# Patient Record
Sex: Female | Born: 1950 | Race: White | Hispanic: No | Marital: Single | Smoking: Current every day smoker
Health system: Southern US, Community
[De-identification: ages and names within clinical notes are randomized; demographics above are authoritative.]

## PROBLEM LIST (undated history)

## (undated) DIAGNOSIS — E785 Hyperlipidemia, unspecified: Secondary | ICD-10-CM

## (undated) DIAGNOSIS — I10 Essential (primary) hypertension: Secondary | ICD-10-CM

## (undated) DIAGNOSIS — J9819 Other pulmonary collapse: Secondary | ICD-10-CM

## (undated) DIAGNOSIS — C801 Malignant (primary) neoplasm, unspecified: Secondary | ICD-10-CM

## (undated) HISTORY — DX: Malignant (primary) neoplasm, unspecified: C80.1

## (undated) HISTORY — DX: Hyperlipidemia, unspecified: E78.5

## (undated) HISTORY — DX: Essential (primary) hypertension: I10

## (undated) HISTORY — DX: Other pulmonary collapse: J98.19

---

## 2004-10-14 ENCOUNTER — Emergency Department: Payer: Self-pay | Admitting: Unknown Physician Specialty

## 2004-10-14 ENCOUNTER — Ambulatory Visit: Payer: Self-pay | Admitting: Unknown Physician Specialty

## 2004-11-21 ENCOUNTER — Ambulatory Visit: Payer: Self-pay | Admitting: Internal Medicine

## 2004-11-21 LAB — HM COLONOSCOPY

## 2006-04-28 ENCOUNTER — Inpatient Hospital Stay: Payer: Self-pay | Admitting: Internal Medicine

## 2008-12-27 ENCOUNTER — Ambulatory Visit: Payer: Self-pay | Admitting: Orthopedic Surgery

## 2009-01-04 ENCOUNTER — Ambulatory Visit: Payer: Self-pay | Admitting: Orthopedic Surgery

## 2011-03-12 LAB — HM MAMMOGRAPHY

## 2012-11-09 ENCOUNTER — Ambulatory Visit: Payer: Self-pay

## 2013-03-02 ENCOUNTER — Ambulatory Visit: Payer: Self-pay

## 2013-03-28 LAB — HM PAP SMEAR

## 2014-06-19 IMAGING — CR DG HIP COMPLETE 2+V*L*
1 series · 3 of 3 positions shown · non-contrast
Comparison: None.

CLINICAL DATA: Left hip pain, groin pain for 2 weeks

EXAM:
LEFT HIP - COMPLETE 2+ VIEW

[Series 1: ap · 0.17mm/px · 3 of 3 slices shown]
[im 1/3]
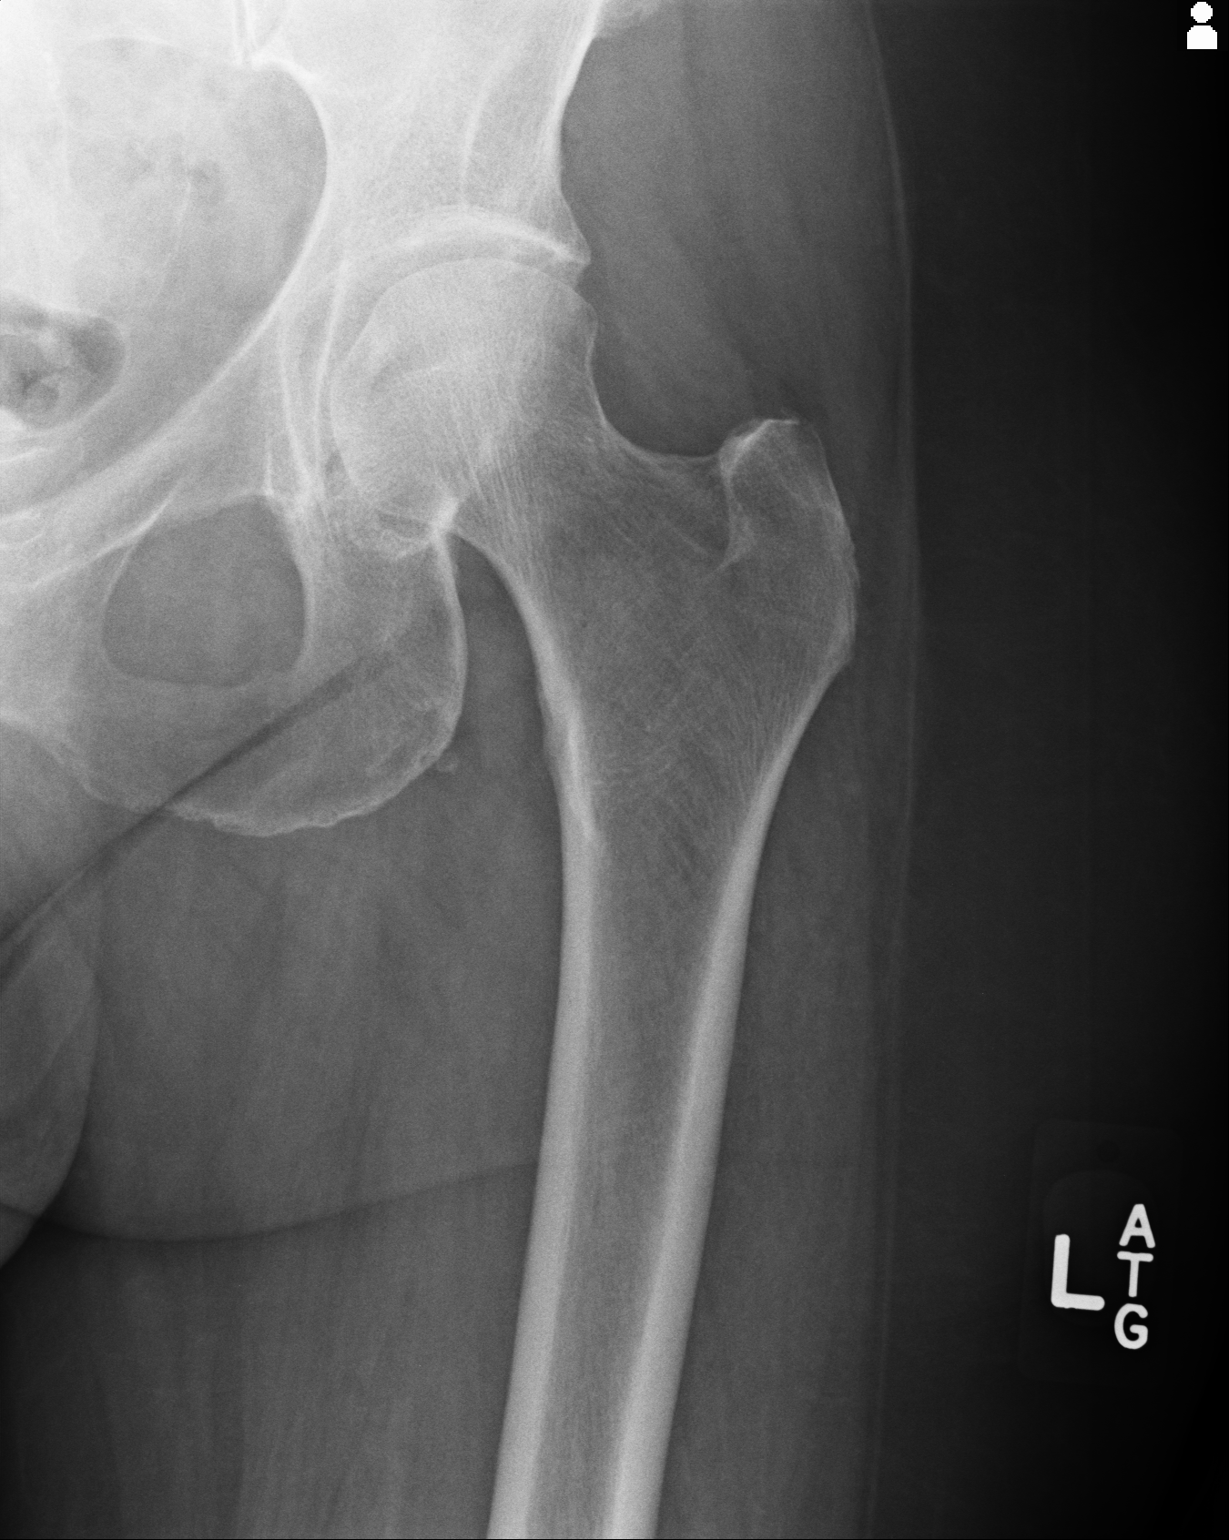
[im 2/3]
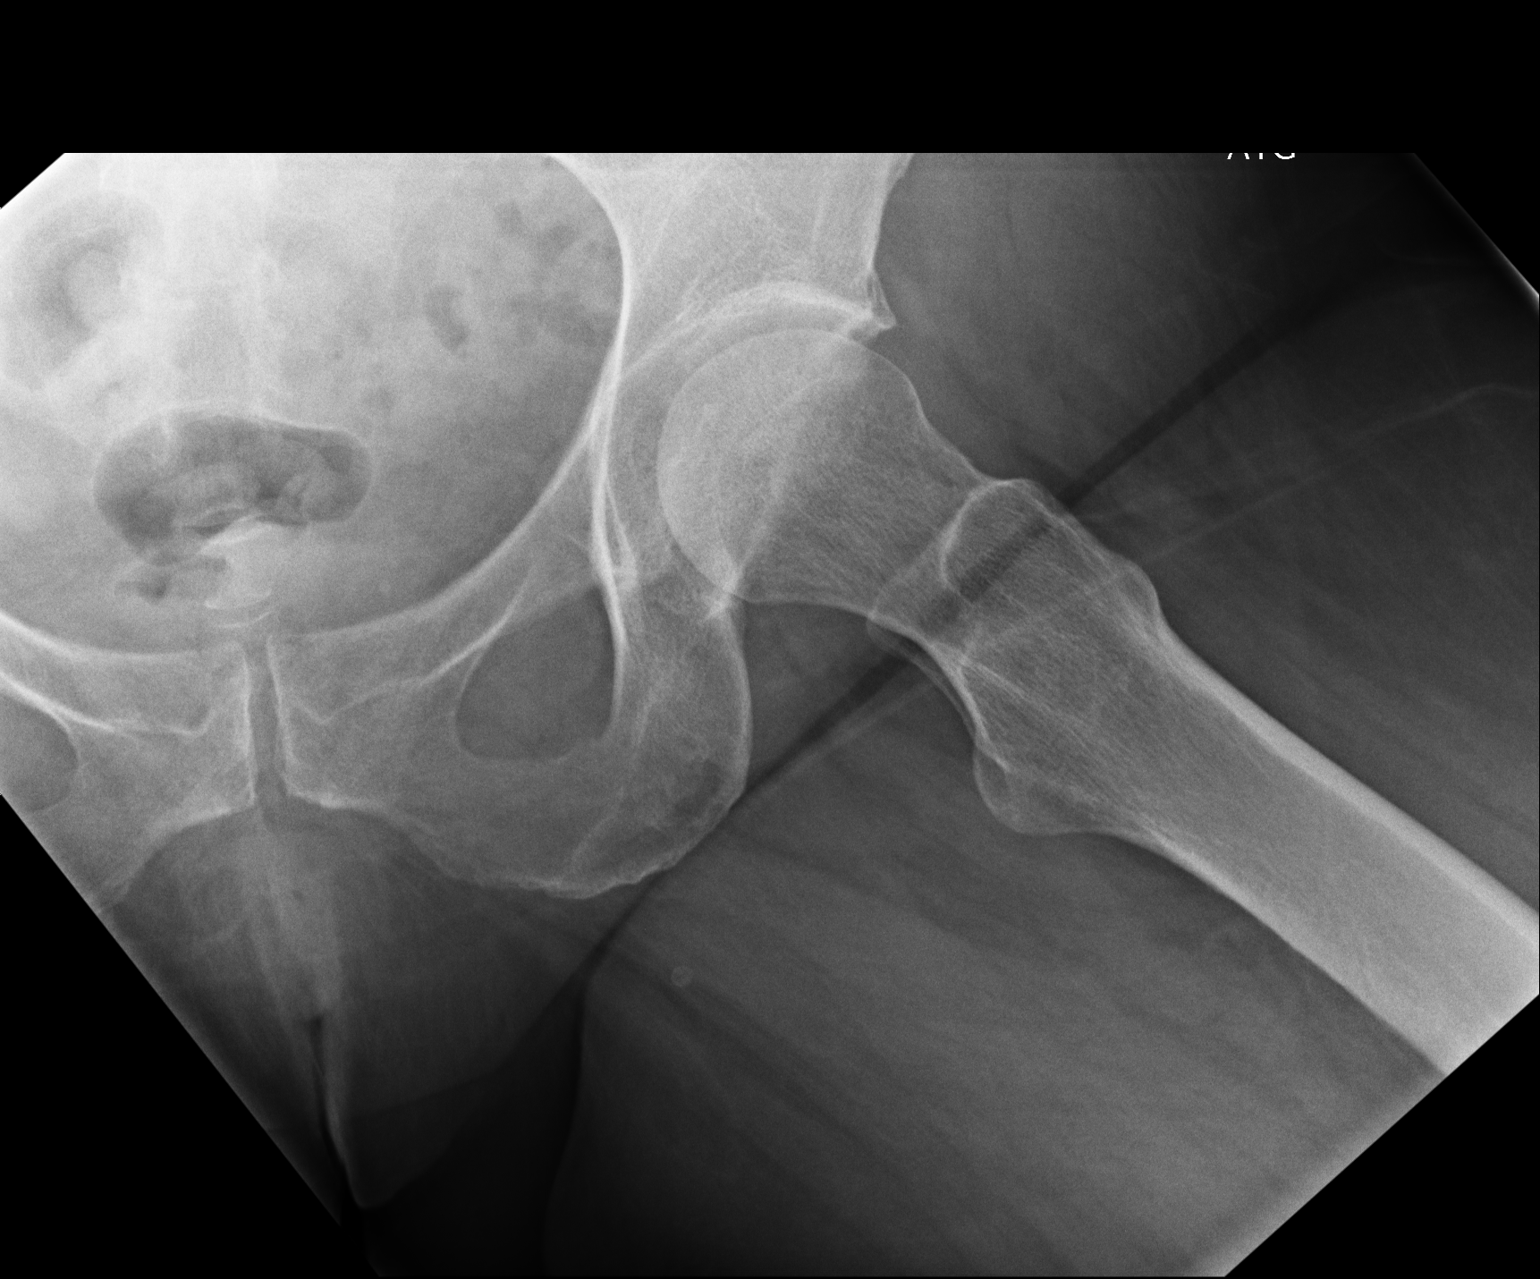
[im 3/3]
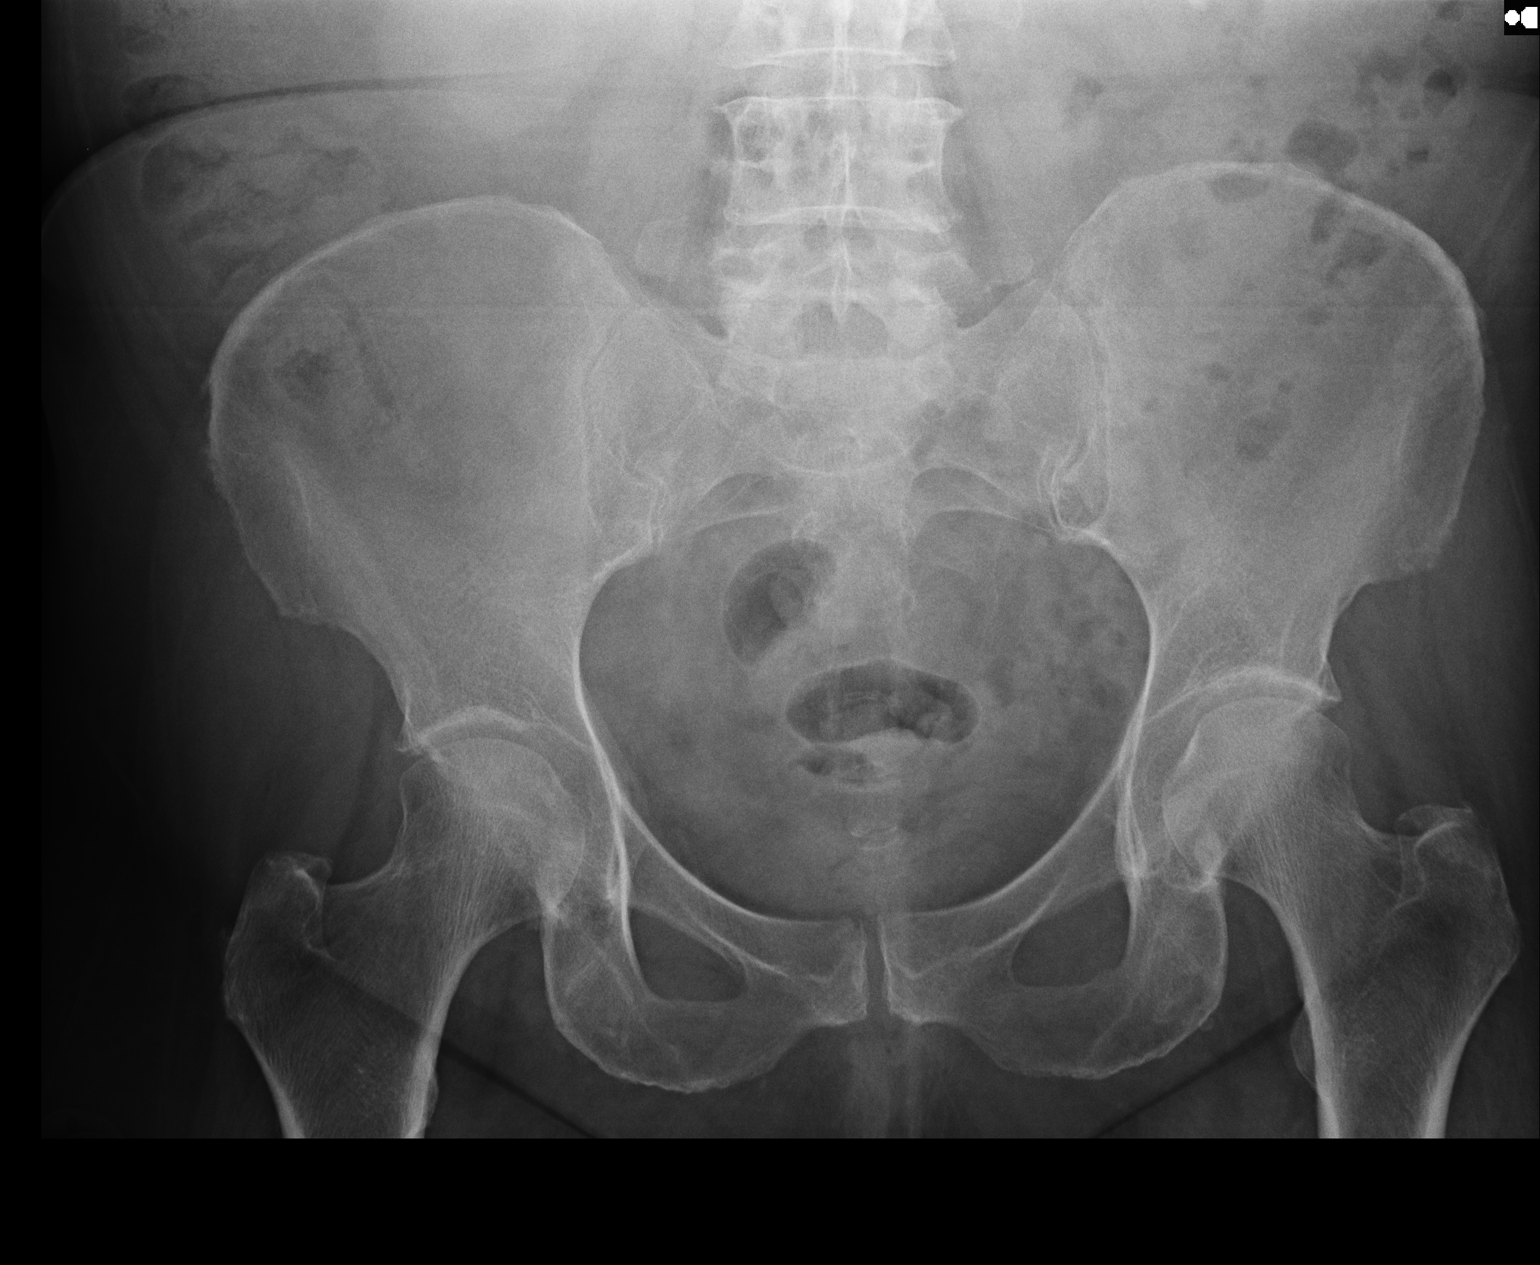

[3 of 3 positions shown; findings below may reference images not displayed]

FINDINGS: There is no evidence of hip fracture or dislocation. There is no
evidence of arthropathy or other focal bone abnormality.
IMPRESSION: Negative.

## 2015-01-05 DIAGNOSIS — Z23 Encounter for immunization: Secondary | ICD-10-CM | POA: Diagnosis not present

## 2015-01-23 DIAGNOSIS — K5790 Diverticulosis of intestine, part unspecified, without perforation or abscess without bleeding: Secondary | ICD-10-CM | POA: Insufficient documentation

## 2015-01-23 DIAGNOSIS — E785 Hyperlipidemia, unspecified: Secondary | ICD-10-CM | POA: Insufficient documentation

## 2015-01-23 DIAGNOSIS — E669 Obesity, unspecified: Secondary | ICD-10-CM | POA: Insufficient documentation

## 2015-01-23 DIAGNOSIS — J449 Chronic obstructive pulmonary disease, unspecified: Secondary | ICD-10-CM | POA: Insufficient documentation

## 2015-01-23 DIAGNOSIS — M8580 Other specified disorders of bone density and structure, unspecified site: Secondary | ICD-10-CM

## 2015-01-23 DIAGNOSIS — I10 Essential (primary) hypertension: Secondary | ICD-10-CM | POA: Insufficient documentation

## 2015-01-23 DIAGNOSIS — R011 Cardiac murmur, unspecified: Secondary | ICD-10-CM | POA: Insufficient documentation

## 2015-04-16 ENCOUNTER — Telehealth: Payer: Self-pay | Admitting: Unknown Physician Specialty

## 2015-04-16 MED ORDER — ATORVASTATIN CALCIUM 20 MG PO TABS: 20.0000 mg | ORAL_TABLET | Freq: Every day | ORAL | Status: DC

## 2015-04-16 MED ORDER — LISINOPRIL 20 MG PO TABS
20.0000 mg | ORAL_TABLET | Freq: Every day | ORAL | Status: DC
Start: 2015-04-16 — End: 2015-05-01

## 2015-04-16 NOTE — Telephone Encounter (Signed)
Pt needs refill on atorvastatin (LIPITOR) 20 MG tablet and lisinopril (PRINIVIL,ZESTRIL) 20 MG tablet sent to [address] pharmacy

## 2015-04-16 NOTE — Telephone Encounter (Signed)
Routing to provider. Patient has upcoming appointment 05/01/15.

## 2015-04-16 NOTE — Telephone Encounter (Signed)
Rxs sent to her pharmacy 

## 2015-05-01 ENCOUNTER — Encounter: Payer: Self-pay | Admitting: Unknown Physician Specialty

## 2015-05-01 ENCOUNTER — Ambulatory Visit (INDEPENDENT_AMBULATORY_CARE_PROVIDER_SITE_OTHER): Payer: Medicare Other | Admitting: Unknown Physician Specialty

## 2015-05-01 ENCOUNTER — Telehealth: Payer: Self-pay | Admitting: Unknown Physician Specialty

## 2015-05-01 ENCOUNTER — Telehealth: Payer: Self-pay

## 2015-05-01 ENCOUNTER — Ambulatory Visit
Admission: RE | Admit: 2015-05-01 | Discharge: 2015-05-01 | Disposition: A | Discharge status: Home / Self Care | Payer: Medicare Other | Source: Ambulatory Visit | Attending: Unknown Physician Specialty | Admitting: Unknown Physician Specialty

## 2015-05-01 VITALS — BP 157/79 | HR 93 | Temp 97.6°F | Ht 64.1 in | Wt 151.2 lb

## 2015-05-01 DIAGNOSIS — Z13820 Encounter for screening for osteoporosis: Secondary | ICD-10-CM

## 2015-05-01 DIAGNOSIS — F17200 Nicotine dependence, unspecified, uncomplicated: Secondary | ICD-10-CM | POA: Diagnosis not present

## 2015-05-01 DIAGNOSIS — R634 Abnormal weight loss: Secondary | ICD-10-CM

## 2015-05-01 DIAGNOSIS — I10 Essential (primary) hypertension: Secondary | ICD-10-CM

## 2015-05-01 DIAGNOSIS — E785 Hyperlipidemia, unspecified: Secondary | ICD-10-CM | POA: Diagnosis not present

## 2015-05-01 DIAGNOSIS — Z1231 Encounter for screening mammogram for malignant neoplasm of breast: Secondary | ICD-10-CM

## 2015-05-01 DIAGNOSIS — R011 Cardiac murmur, unspecified: Secondary | ICD-10-CM

## 2015-05-01 DIAGNOSIS — Z0000 Encounter for general adult medical examination without abnormal findings: Secondary | ICD-10-CM | POA: Diagnosis not present

## 2015-05-01 DIAGNOSIS — J449 Chronic obstructive pulmonary disease, unspecified: Secondary | ICD-10-CM | POA: Insufficient documentation

## 2015-05-01 DIAGNOSIS — Z23 Encounter for immunization: Secondary | ICD-10-CM | POA: Diagnosis not present

## 2015-05-01 LAB — MICROALBUMIN, URINE WAIVED
CREATININE, URINE WAIVED: 10 mg/dL (ref 10–300)
MICROALB, UR WAIVED: 10 mg/L (ref 0–19)
Microalb/Creat Ratio: <30 mg/g (ref <30)

## 2015-05-01 MED ORDER — VERAPAMIL HCL ER 240 MG PO TBCR: 240.0000 mg | EXTENDED_RELEASE_TABLET | Freq: Every day | ORAL | Status: DC

## 2015-05-01 MED ORDER — ATORVASTATIN CALCIUM 20 MG PO TABS: 20.0000 mg | ORAL_TABLET | Freq: Every day | ORAL | Status: AC

## 2015-05-01 MED ORDER — LISINOPRIL 20 MG PO TABS: 20.0000 mg | ORAL_TABLET | Freq: Every day | ORAL | Status: DC

## 2015-05-01 MED ORDER — VERAPAMIL HCL ER 240 MG PO CP24: 240.0000 mg | ORAL_CAPSULE | Freq: Every day | ORAL | Status: DC

## 2015-05-01 NOTE — Patient Instructions (Signed)
Go to the [employer] office today to get a chest x-ray  Please do call to schedule your mammogram and bone density; the number to schedule one at either [facility] or [facility] is [phone]

## 2015-05-01 NOTE — Progress Notes (Signed)
BP 157/79 mmHg  Pulse 93  Temp(Src) 97.6 F (36.4 C)  Ht 5' 4.1" (1.628 m)  Wt 151 lb 3.2 oz (68.584 kg)  BMI 25.88 kg/m2  SpO2 94%  LMP  (LMP Unknown)   Subjective:    Patient ID: Veronica Hernandez, female    DOB: [birth year], 64 y.o.   MRN: [id number]  HPI: Veronica Hernandez is a 65 y.o. female  Chief Complaint  Patient presents with  . Medicare Wellness  . Medication Refill    pt states she needs refills on all meds   Functional Status Survey: Is the patient deaf or have difficulty hearing?: No Does the patient have difficulty seeing, even when wearing glasses/contacts?: Yes (pt states she has to wear her glasses) Does the patient have difficulty concentrating, remembering, or making decisions?: No Does the patient have difficulty walking or climbing stairs?: No Does the patient have difficulty dressing or bathing?: No Does the patient have difficulty doing errands alone such as visiting a doctor's office or shopping?: No Fall Risk  05/01/2015  Falls in the past year? No   Depression screen PHQ 2/9 05/01/2015  Decreased Interest 0  Down, Depressed, Hopeless 0  PHQ - 2 Score 0    Family History  Problem Relation Age of Onset  . Hypertension Mother   . Stroke Mother   . Diabetes Mother   . Glaucoma Mother   . Cancer Sister     ovarian and skin  . Glaucoma Sister   . Hypertension Sister   . Hypertension Sister   . Cancer Brother   . Diabetes Brother   . Hypertension Brother   . Glaucoma Brother    Social History   Social History  . Marital Status: Single    Spouse Name: N/A  . Number of Children: N/A  . Years of Education: N/A   Social History Main Topics  . Smoking status: Current Every Day Smoker -- 0.50 packs/day    Types: Cigarettes  . Smokeless tobacco: Never Used  . Alcohol Use: 3.6 oz/week    0 Standard drinks or equivalent, 6 Cans of beer per week  . Drug Use: No  . Sexual Activity: No   Other Topics Concern  . None   Social History Narrative    Past Medical History  Diagnosis Date  . Lung collapse   . Hypertension   . Hyperlipidemia   . Cancer ([place])     basal cell on face    Hypertension Using medications without difficulty  No problems or lightheadedness No chest pain with exertion or shortness of breath No Edema   Hyperlipidemia Using medications without problems: No Muscle aches  Diet compliance Exercise:  COPD Feels symptoms are well controlled Using medications without problems Night time symptoms: none ER visits since last visit:none Missed work or school::none Increased cough:none Increased [id number]  Weight loss Noted over 20 pound weight loss.  Pt states she is not trying to lose weight but "cut back on eating."  Smoking 1/2 ppd for many years    Relevant past medical, surgical, family and social history reviewed and updated as indicated. Interim medical history since our last visit reviewed. Allergies and medications reviewed and updated.  Review of Systems  Per HPI unless specifically indicated above     Objective:    BP 157/79 mmHg  Pulse 93  Temp(Src) 97.6 F (36.4 C)  Ht 5' 4.1" (1.628 m)  Wt 151 lb 3.2 oz (68.584 kg)  BMI 25.88 kg/m2  SpO2  94%  LMP  (LMP Unknown)  Wt Readings from Last 3 Encounters:  05/01/15 151 lb 3.2 oz (68.584 kg)  04/26/14 176 lb (79.833 kg)    Physical Exam  Constitutional: She is oriented to person, place, and time. She appears well-developed and well-nourished.  HENT:  Head: Normocephalic and atraumatic.  Eyes: Pupils are equal, round, and reactive to light. Right eye exhibits no discharge. Left eye exhibits no discharge. No scleral icterus.  Neck: Normal range of motion. Neck supple. Carotid bruit is not present. No thyromegaly present.  Cardiovascular: Normal rate and regular rhythm.  Exam reveals no gallop and no friction rub.   Murmur heard.  Systolic murmur is present with a grade of 2/6  Pulmonary/Chest: Effort normal and breath sounds  normal. No respiratory distress. She has no wheezes. She has no rales.  Abdominal: Soft. Bowel sounds are normal. There is no tenderness. There is no rebound.  Genitourinary: No breast swelling, tenderness or discharge.  Musculoskeletal: Normal range of motion.  Lymphadenopathy:    She has no cervical adenopathy.  Neurological: She is alert and oriented to person, place, and time.  Skin: Skin is warm, dry and intact. No rash noted.  Psychiatric: She has a normal mood and affect. Her speech is normal and behavior is normal. Judgment and thought content normal. Cognition and memory are normal.    Results for orders placed or performed in visit on 01/23/15  HM MAMMOGRAPHY  Result Value Ref Range   HM Mammogram from PP   HM PAP SMEAR  Result Value Ref Range   HM Pap smear from PP   HM COLONOSCOPY  Result Value Ref Range   HM Colonoscopy from PP       Assessment & Plan:   Problem List Items Addressed This Visit      Unprioritized   Hypertension   Relevant Medications   atorvastatin (LIPITOR) 20 MG tablet   lisinopril (PRINIVIL,ZESTRIL) 20 MG tablet   verapamil (VERELAN PM) 240 MG 24 hr capsule   Other Relevant Orders   Comprehensive metabolic panel   Microalbumin, Urine Waived   Uric acid   Hyperlipidemia   Relevant Medications   atorvastatin (LIPITOR) 20 MG tablet   lisinopril (PRINIVIL,ZESTRIL) 20 MG tablet   verapamil (VERELAN PM) 240 MG 24 hr capsule   Heart murmur    No change.  Right atrial enlargement on EKG.  ? Pulmonary disease.  Chest x-ray ordered      Abnormal weight loss   Relevant Orders   DG Chest 2 View   CBC with Differential/Platelet   TSH   HIV antibody   Tobacco dependence   Relevant Orders   Lipid Panel w/o Chol/HDL Ratio    Other Visit Diagnoses    Need for pneumococcal vaccination    -  Primary    Relevant Orders    Pneumococcal conjugate vaccine 13-valent IM (Completed)    Routine general medical examination at a health care facility         Relevant Orders    EKG 12-Lead (Completed)    Hepatitis C antibody    Cologuard    Encounter for screening mammogram for breast cancer        Relevant Orders    MM DIGITAL SCREENING BILATERAL    Encounter for screening for osteoporosis           Needs chest x-ray for abnormal weight loss.  BP is not to goal but before making any changes, need to evaluated weight  loss.    Unable to find a diagnosis for medicare approval of bone density Will follow at a later date  Follow up plan: Return in about 4 weeks (around 05/29/2015).  Addendum: Chest x-ray is negative.  Await labs, order low dose CT and refer to GI ASAP

## 2015-05-01 NOTE — Telephone Encounter (Signed)
Pharmacy sent a fax stating that verapamil (verelan PM) 240 mg is no such drug. They state Verelan PM comes in 100, 200, or 300 mg and SR comes in 240 and 120 mg. They need clarification on this medication.

## 2015-05-01 NOTE — Assessment & Plan Note (Signed)
No change.  Right atrial enlargement on EKG.  ? Pulmonary disease.  Chest x-ray ordered

## 2015-05-01 NOTE — Telephone Encounter (Signed)
Please call pharmacy and tell them I changed it to Calan-SR and sent it electronically

## 2015-05-01 NOTE — Telephone Encounter (Signed)
Discussed with pt normal x-ray.  Chest CT and appt with GI pending.  Pt needs to go to the draw center for labs as unable to get here

## 2015-05-02 ENCOUNTER — Other Ambulatory Visit: Payer: Self-pay | Admitting: Internal Medicine

## 2015-05-02 DIAGNOSIS — R634 Abnormal weight loss: Secondary | ICD-10-CM | POA: Diagnosis not present

## 2015-05-02 DIAGNOSIS — Z0000 Encounter for general adult medical examination without abnormal findings: Secondary | ICD-10-CM | POA: Diagnosis not present

## 2015-05-02 DIAGNOSIS — F17200 Nicotine dependence, unspecified, uncomplicated: Secondary | ICD-10-CM | POA: Diagnosis not present

## 2015-05-02 DIAGNOSIS — I10 Essential (primary) hypertension: Secondary | ICD-10-CM | POA: Diagnosis not present

## 2015-05-03 LAB — CBC WITH DIFFERENTIAL/PLATELET
BASOS ABS: 0 10*3/uL (ref 0.0–0.2)
Basos: 0 %
EOS (ABSOLUTE): 0 10*3/uL (ref 0.0–0.4)
Eos: 1 %
Hematocrit: 45.4 % (ref 34.0–46.6)
Hemoglobin: 15.8 g/dL (ref 11.1–15.9)
Immature Grans (Abs): 0 10*3/uL (ref 0.0–0.1)
Immature Granulocytes: 0 %
LYMPHS ABS: 1.5 10*3/uL (ref 0.7–3.1)
Lymphs: 19 %
MCH: 33.3 pg — AB (ref 26.6–33.0)
MCHC: 34.8 g/dL (ref 31.5–35.7)
MCV: 96 fL (ref 79–97)
Monocytes Absolute: 0.7 10*3/uL (ref 0.1–0.9)
Monocytes: 8 %
NEUTROS ABS: 5.8 10*3/uL (ref 1.4–7.0)
Neutrophils: 72 %
PLATELETS: 416 10*3/uL — AB (ref 150–379)
RBC: 4.74 x10E6/uL (ref 3.77–5.28)
RDW: 14.5 % (ref 12.3–15.4)
WBC: 8.1 10*3/uL (ref 3.4–10.8)

## 2015-05-03 LAB — COMPREHENSIVE METABOLIC PANEL
ALT: 24 IU/L (ref 0–32)
AST: 22 IU/L (ref 0–40)
Albumin/Globulin Ratio: 1.7 (ref 1.2–2.2)
Albumin: 4.6 g/dL (ref 3.6–4.8)
Alkaline Phosphatase: 97 IU/L (ref 39–117)
BUN/Creatinine Ratio: 11 (ref 11–26)
BUN: 7 mg/dL — ABNORMAL LOW (ref 8–27)
Bilirubin Total: 0.6 mg/dL (ref 0.0–1.2)
CALCIUM: 9.8 mg/dL (ref 8.7–10.3)
CO2: 26 mmol/L (ref 18–29)
CREATININE: 0.64 mg/dL (ref 0.57–1.00)
Chloride: 81 mmol/L — ABNORMAL LOW (ref 96–106)
GFR calc non Af Amer: 94 mL/min/{1.73_m2} (ref >59)
GFR, EST AFRICAN AMERICAN: 108 mL/min/{1.73_m2} (ref >59)
GLUCOSE: 104 mg/dL — AB (ref 65–99)
Globulin, Total: 2.7 g/dL (ref 1.5–4.5)
Potassium: 4.7 mmol/L (ref 3.5–5.2)
SODIUM: 127 mmol/L — AB (ref 134–144)
Total Protein: 7.3 g/dL (ref 6.0–8.5)

## 2015-05-03 LAB — LIPID PANEL W/O CHOL/HDL RATIO
CHOLESTEROL TOTAL: 137 mg/dL (ref 100–199)
HDL: 105 mg/dL (ref >39)
LDL Calculated: 23 mg/dL (ref 0–99)
TRIGLYCERIDES: 43 mg/dL (ref 0–149)
VLDL CHOLESTEROL CAL: 9 mg/dL (ref 5–40)

## 2015-05-03 LAB — HIV ANTIBODY (ROUTINE TESTING W REFLEX): HIV Screen 4th Generation wRfx: NONREACTIVE

## 2015-05-03 LAB — HEPATITIS C ANTIBODY

## 2015-05-03 LAB — TSH: TSH: 1.51 u[IU]/mL (ref 0.450–4.500)

## 2015-05-03 LAB — URIC ACID: Uric Acid: 4.2 mg/dL (ref 2.5–7.1)

## 2015-05-04 ENCOUNTER — Telehealth: Payer: Self-pay | Admitting: Unknown Physician Specialty

## 2015-05-04 ENCOUNTER — Other Ambulatory Visit: Payer: Self-pay | Admitting: Unknown Physician Specialty

## 2015-05-04 DIAGNOSIS — E871 Hypo-osmolality and hyponatremia: Secondary | ICD-10-CM

## 2015-05-04 DIAGNOSIS — F17200 Nicotine dependence, unspecified, uncomplicated: Secondary | ICD-10-CM

## 2015-05-04 DIAGNOSIS — R05 Cough: Secondary | ICD-10-CM

## 2015-05-04 DIAGNOSIS — R059 Cough, unspecified: Secondary | ICD-10-CM

## 2015-05-04 DIAGNOSIS — L309 Dermatitis, unspecified: Secondary | ICD-10-CM

## 2015-05-04 DIAGNOSIS — R634 Abnormal weight loss: Secondary | ICD-10-CM

## 2015-05-04 MED ORDER — TRIAMCINOLONE ACETONIDE 0.1 % EX CREA: 1.0000 "application " | TOPICAL_CREAM | Freq: Two times a day (BID) | CUTANEOUS | Status: AC

## 2015-05-04 NOTE — Telephone Encounter (Signed)
Talked to patient and ordered a CT due to weight loss, hyponatremia, cough and smoker.  Chest x-ray was negative

## 2015-05-09 ENCOUNTER — Ambulatory Visit: Admission: RE | Admit: 2015-05-09 | Payer: Medicare Other | Source: Ambulatory Visit

## 2015-05-21 ENCOUNTER — Other Ambulatory Visit: Payer: Self-pay | Admitting: Unknown Physician Specialty

## 2015-05-21 MED ORDER — ALBUTEROL SULFATE HFA 108 (90 BASE) MCG/ACT IN AERS: 2.0000 | INHALATION_SPRAY | Freq: Four times a day (QID) | RESPIRATORY_TRACT | Status: DC | PRN

## 2015-05-21 NOTE — Telephone Encounter (Signed)
Patient was last seen 05/01/15.

## 2015-05-21 NOTE — Telephone Encounter (Signed)
Pt called stated she needs a refill on her Pro-Air she has not been able to get this refilled. Pharm is [facility] on [address]. Thanks.

## 2015-05-28 ENCOUNTER — Ambulatory Visit: Payer: Medicare Other

## 2015-06-01 ENCOUNTER — Ambulatory Visit: Payer: Medicare Other | Admitting: Unknown Physician Specialty

## 2015-06-01 ENCOUNTER — Telehealth: Payer: Self-pay

## 2015-06-01 NOTE — Telephone Encounter (Signed)
Called and left patient a voicemail asking for her to please return my call.  

## 2015-06-01 NOTE — Telephone Encounter (Signed)
-----   Message from Kathrine Haddock, NP sent at 06/01/2015 12:20 PM EDT ----- Regarding: RE: cancelled CT chest Thanks, [place], can you find out why she cancelled this? ----- Message -----    From: Lieutenant Diego, RN    Sent: 06/01/2015  10:00 AM      To: Kathrine Haddock, NP Subject: cancelled CT chest                             Wellington [place], It looks like Ms. Oran cancelled her CT. Just wanted to make sure everything was okay and that you were aware. Shawn

## 2015-06-01 NOTE — Telephone Encounter (Signed)
Patient returned my call. She stated that she had a bill to pay before she had the CT done. Patient stated she was going to reschedule the CT as soon as she paid the bill off.

## 2015-06-12 ENCOUNTER — Telehealth: Payer: Self-pay | Admitting: Unknown Physician Specialty

## 2015-06-12 NOTE — Telephone Encounter (Signed)
Chest CT is what is ordered in the chart.

## 2015-06-12 NOTE — Telephone Encounter (Signed)
Patient returned call. I let her know that Malachy Mood was wanting a chest CT, not an x-ray. I gave the patient the outpatient imaging center's phone number.

## 2015-06-12 NOTE — Telephone Encounter (Signed)
I know what is in the chart.  Can you ask the pt why she is asking about an x-ray?

## 2015-06-12 NOTE — Telephone Encounter (Signed)
Patient called and wants to know why she needs to have another x-ray done when she had one done on March 28. She would like to talk to Malachy Mood about this, please call patient back [phone].

## 2015-06-12 NOTE — Telephone Encounter (Signed)
Called and left patient a voicemail asking for her to please return my call. m

## 2015-06-12 NOTE — Telephone Encounter (Signed)
Routing to Cheryl

## 2015-06-12 NOTE — Telephone Encounter (Signed)
I ordered a chest CT due to weight loss and COPD.  Not another x-ray.  Can you confirm this [place]?

## 2015-12-09 DIAGNOSIS — Z23 Encounter for immunization: Secondary | ICD-10-CM | POA: Diagnosis not present

## 2016-03-24 ENCOUNTER — Other Ambulatory Visit: Payer: Self-pay | Admitting: Unknown Physician Specialty

## 2016-08-13 ENCOUNTER — Encounter: Payer: Medicare Other | Admitting: Unknown Physician Specialty

## 2016-08-17 IMAGING — DX DG CHEST 2V
2 series · 2 of 2 positions shown · non-contrast
Comparison: 11/09/2012

CLINICAL DATA: Abnormal weight loss

EXAM:
CHEST  2 VIEW

[chest lat]
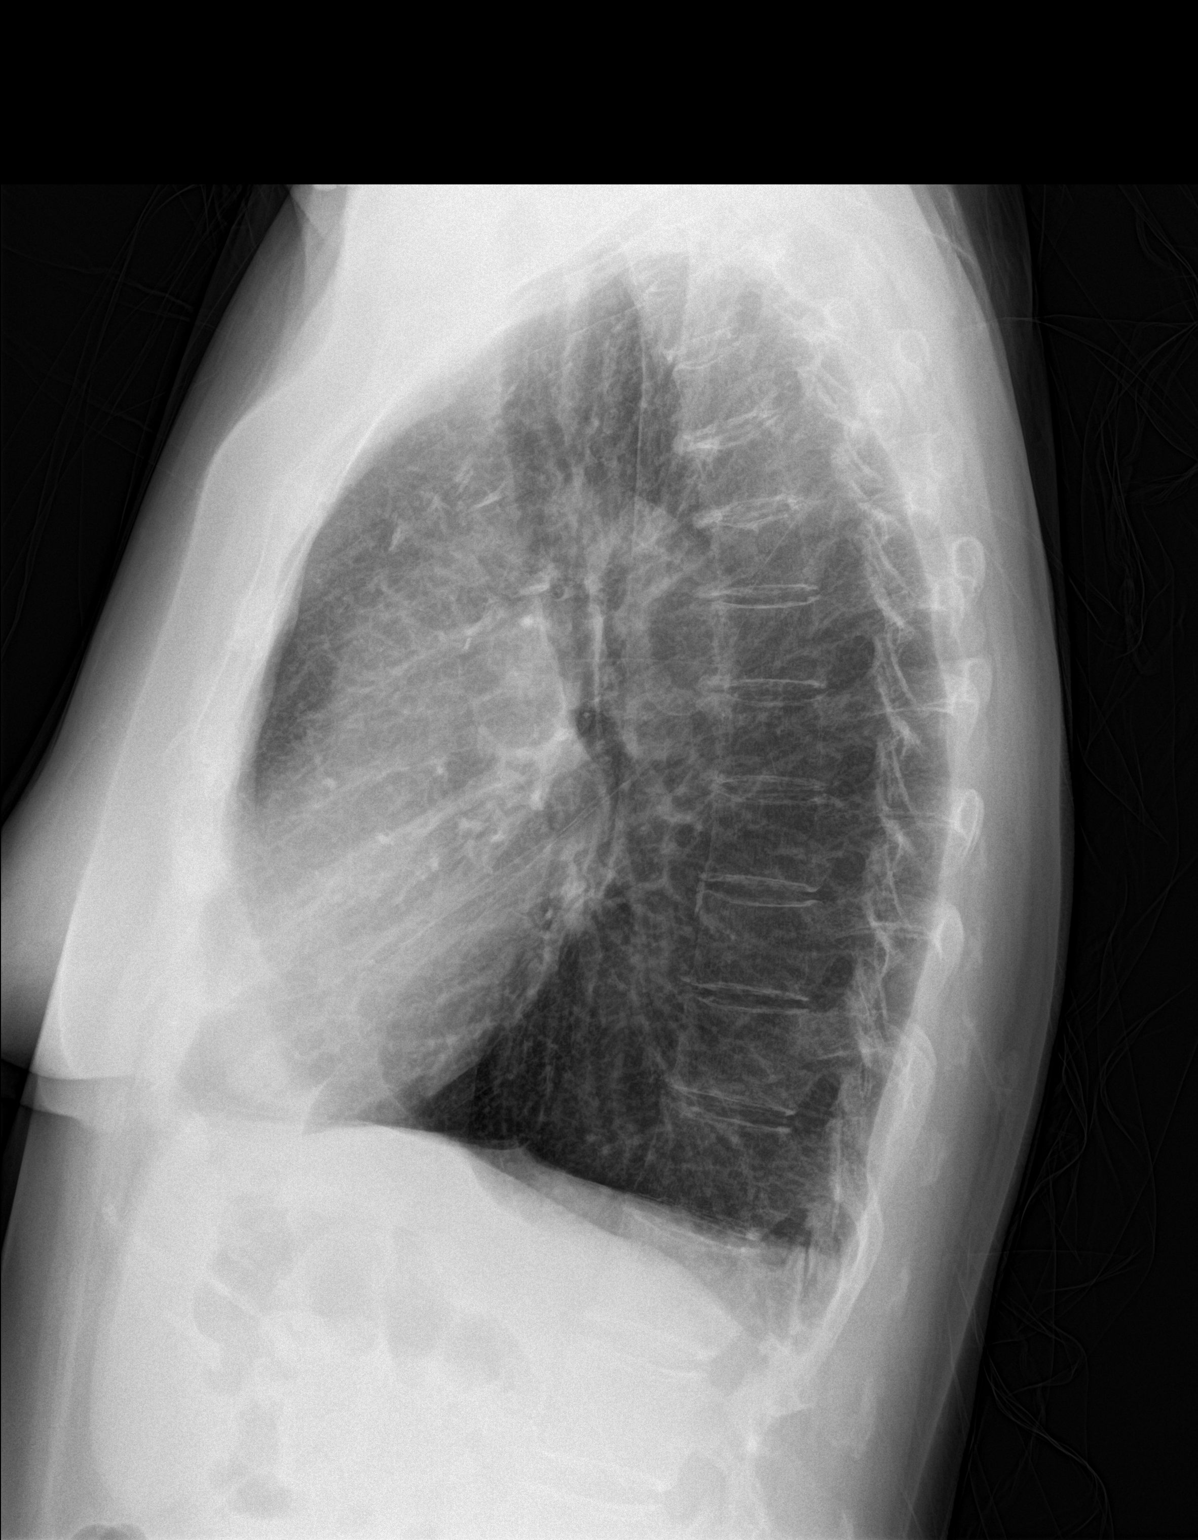

[chest pa]
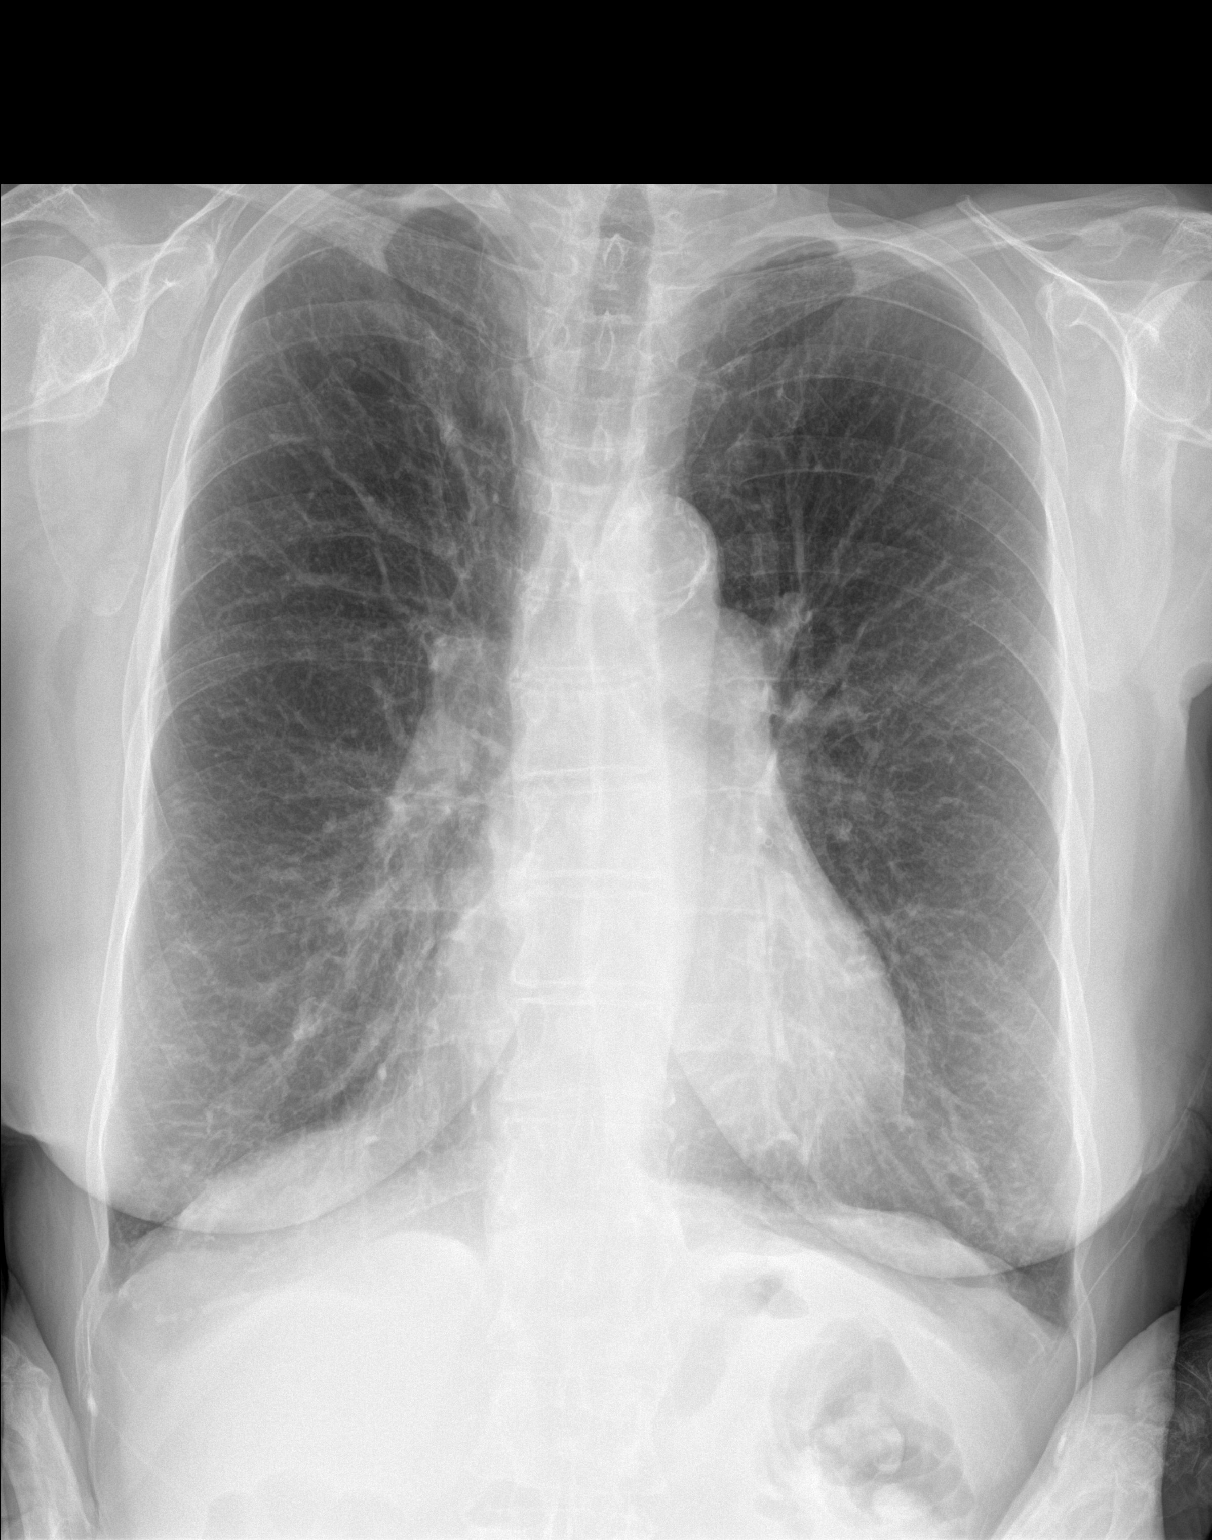

[2 of 2 positions shown; findings below may reference images not displayed]

FINDINGS: The heart size and mediastinal contours are within normal limits.
Both lungs are hyperinflated. Apical scarring is noted. The
visualized skeletal structures are unremarkable.
IMPRESSION: COPD without acute abnormality.

## 2016-09-24 ENCOUNTER — Encounter: Payer: Self-pay | Admitting: Unknown Physician Specialty

## 2016-09-24 ENCOUNTER — Ambulatory Visit (INDEPENDENT_AMBULATORY_CARE_PROVIDER_SITE_OTHER): Payer: Medicare Other | Admitting: Unknown Physician Specialty

## 2016-09-24 VITALS — BP 214/118 | HR 116 | Temp 98.3°F | Ht 63.6 in | Wt 106.1 lb

## 2016-09-24 DIAGNOSIS — I10 Essential (primary) hypertension: Secondary | ICD-10-CM | POA: Diagnosis not present

## 2016-09-24 DIAGNOSIS — J449 Chronic obstructive pulmonary disease, unspecified: Secondary | ICD-10-CM

## 2016-09-24 DIAGNOSIS — Z0000 Encounter for general adult medical examination without abnormal findings: Secondary | ICD-10-CM

## 2016-09-24 DIAGNOSIS — E871 Hypo-osmolality and hyponatremia: Secondary | ICD-10-CM | POA: Insufficient documentation

## 2016-09-24 DIAGNOSIS — R634 Abnormal weight loss: Secondary | ICD-10-CM | POA: Diagnosis not present

## 2016-09-24 DIAGNOSIS — Z23 Encounter for immunization: Secondary | ICD-10-CM

## 2016-09-24 DIAGNOSIS — R011 Cardiac murmur, unspecified: Secondary | ICD-10-CM

## 2016-09-24 DIAGNOSIS — Z7189 Other specified counseling: Secondary | ICD-10-CM

## 2016-09-24 LAB — UA/M W/RFLX CULTURE, ROUTINE
BILIRUBIN UA: NEGATIVE
Glucose, UA: NEGATIVE
LEUKOCYTES UA: NEGATIVE
Nitrite, UA: NEGATIVE
PH UA: 7.5 (ref 5.0–7.5)
RBC, UA: NEGATIVE
SPEC GRAV UA: 1.015 (ref 1.005–1.030)
Urobilinogen, Ur: 0.2 mg/dL (ref 0.2–1.0)

## 2016-09-24 LAB — MICROSCOPIC EXAMINATION: Bacteria, UA: NONE SEEN

## 2016-09-24 MED ORDER — ALBUTEROL SULFATE HFA 108 (90 BASE) MCG/ACT IN AERS: 2.0000 | INHALATION_SPRAY | Freq: Four times a day (QID) | RESPIRATORY_TRACT | 3 refills | Status: AC | PRN

## 2016-09-24 MED ORDER — LISINOPRIL 40 MG PO TABS: 40.0000 mg | ORAL_TABLET | Freq: Every day | ORAL | 3 refills | Status: AC

## 2016-09-24 MED ORDER — AMLODIPINE BESYLATE 10 MG PO TABS: 10.0000 mg | ORAL_TABLET | Freq: Every day | ORAL | 3 refills | Status: AC

## 2016-09-24 MED ORDER — FLUTICASONE-SALMETEROL 250-50 MCG/DOSE IN AEPB: 1.0000 | INHALATION_SPRAY | Freq: Two times a day (BID) | RESPIRATORY_TRACT | 10 refills | Status: AC

## 2016-09-24 MED ORDER — VERAPAMIL HCL ER 240 MG PO TBCR: 240.0000 mg | EXTENDED_RELEASE_TABLET | Freq: Every day | ORAL | 1 refills | Status: AC

## 2016-09-24 NOTE — Patient Instructions (Signed)

## 2016-09-24 NOTE — Assessment & Plan Note (Signed)
With weight loss.  Very concerning for malignancy.  Will check today

## 2016-09-24 NOTE — Assessment & Plan Note (Signed)
Stable, continue present medications.   

## 2016-09-24 NOTE — Assessment & Plan Note (Signed)
A voluntary discussion about advance care planning including the explanation and discussion of advance directives was extensively discussed  with the patient.  Explanation about the health care proxy and Living will was reviewed and packet with forms with explanation of how to fill them out was given.  During this discussion, the patient was able to identify a health care proxy as her boyfriend and plans to fill out the paperwork required.  Patient was offered a separate [place] visit for further assistance with forms.

## 2016-09-24 NOTE — Progress Notes (Signed)
BP (!) 214/118 (BP Location: Left Arm, Cuff Size: Small)   Pulse (!) 116   Temp 98.3 F (36.8 C)   Ht 5' 3.6" (1.615 m)   Wt 106 lb 1.6 oz (48.1 kg)   LMP  (LMP Unknown)   SpO2 94%   BMI 18.44 kg/m    Subjective:    Patient ID: Veronica Hernandez, female    DOB: 12-19-1950, 66 y.o.   MRN: [id number]  HPI: Veronica Hernandez is a 66 y.o. female  Chief Complaint  Patient presents with  . Medicare Wellness   Functional Status Survey: Is the patient deaf or have difficulty hearing?: No Does the patient have difficulty seeing, even when wearing glasses/contacts?: No Does the patient have difficulty concentrating, remembering, or making decisions?: No Does the patient have difficulty walking or climbing stairs?: No Does the patient have difficulty dressing or bathing?: No Does the patient have difficulty doing errands alone such as visiting a doctor's office or shopping?: No Fall Risk  09/24/2016 05/01/2015  Falls in the past year? No No   Depression screen [facility] 2/9 09/24/2016 05/01/2015  Decreased Interest 0 0  Down, Depressed, Hopeless 0 0  PHQ - 2 Score 0 0  Altered sleeping 0 -  Tired, decreased energy 0 -  Change in appetite 0 -  Feeling bad or failure about yourself  0 -  Trouble concentrating 0 -  Moving slowly or fidgety/restless 0 -  Suicidal thoughts 0 -  PHQ-9 Score 0 -   Hypertension Using medications without difficulty Average home BPs Not checking  No problems or lightheadedness No chest pain with exertion or shortness of breath No Edema  Hyperlipidemia Using medications without problems: No Muscle aches  Diet compliance: Exercise:  COPD Feels symptoms are well controlled Using medications without problems:  Taking Advair at night and Proair during the day Night time symptoms: none ER visits since last visit:none Increased cough:no Increased SOB: no Using O2: No  Abnormal weight loss Pt with significant and unintended weight loss.  Notes last visit but lost  to f/u though had a Chest CT ordered but never went.  Order also placed to see GI which again never happened.     Social History   Social History  . Marital status: Single    Spouse name: N/A  . Number of children: N/A  . Years of education: N/A   Occupational History  . Not on file.   Social History Main Topics  . Smoking status: Current Every Day Smoker    Packs/day: 0.50    Types: Cigarettes  . Smokeless tobacco: Never Used  . Alcohol use 3.6 oz/week    6 Cans of beer per week  . Drug use: No  . Sexual activity: No   Other Topics Concern  . Not on file   Social History Narrative  . No narrative on file   Family History  Problem Relation Age of Onset  . Hypertension Mother   . Stroke Mother   . Diabetes Mother   . Glaucoma Mother   . Cancer Sister        ovarian and skin  . Glaucoma Sister   . Hypertension Sister   . Hypertension Sister   . Cancer Brother   . Diabetes Brother   . Hypertension Brother   . Glaucoma Brother    Past Medical History:  Diagnosis Date  . Cancer ([place])    basal cell on face  . Hyperlipidemia   . Hypertension   .  Lung collapse    History reviewed. No pertinent surgical history.  Relevant past medical, surgical, family and social history reviewed and updated as indicated. Interim medical history since our last visit reviewed. Allergies and medications reviewed and updated.  Review of Systems  Constitutional: Positive for fatigue.  HENT: Negative.   Eyes: Negative.   Respiratory: Negative.   Cardiovascular: Negative for chest pain, palpitations and leg swelling.  Gastrointestinal: Negative for constipation, diarrhea and nausea.  Endocrine: Negative.   Genitourinary: Negative.   Musculoskeletal: Negative.   Skin: Negative.   Allergic/Immunologic: Negative.   Psychiatric/Behavioral: Negative.     Per HPI unless specifically indicated above     Objective:    BP (!) 214/118 (BP Location: Left Arm, Cuff Size: Small)    Pulse (!) 116   Temp 98.3 F (36.8 C)   Ht 5' 3.6" (1.615 m)   Wt 106 lb 1.6 oz (48.1 kg)   LMP  (LMP Unknown)   SpO2 94%   BMI 18.44 kg/m   Wt Readings from Last 3 Encounters:  09/24/16 106 lb 1.6 oz (48.1 kg)  05/01/15 151 lb 3.2 oz (68.6 kg)  04/26/14 176 lb (79.8 kg)    Physical Exam  Constitutional: She is oriented to person, place, and time. She appears cachectic. She is cooperative. She appears ill. No distress.  HENT:  Head: Normocephalic and atraumatic.  Eyes: Conjunctivae and lids are normal. Right eye exhibits no discharge. Left eye exhibits no discharge. No scleral icterus.  Neck: Normal range of motion. Neck supple. No JVD present. Carotid bruit is not present.  Cardiovascular: Normal rate and regular rhythm.   Murmur heard.  Systolic murmur is present with a grade of 4/6  Pulmonary/Chest: Effort normal and breath sounds normal.  Abdominal: Normal appearance. There is no splenomegaly or hepatomegaly.  Musculoskeletal: Normal range of motion.  Neurological: She is alert and oriented to person, place, and time.  Skin: Skin is warm, dry and intact. No rash noted. No pallor.  Psychiatric: She has a normal mood and affect. Her behavior is normal. Judgment and thought content normal.    Results for orders placed or performed in visit on 05/01/15  Hepatitis C antibody  Result Value Ref Range   Hep C Virus Ab <0.1 0.0 - 0.9 s/co ratio  Comprehensive metabolic panel  Result Value Ref Range   Glucose 104 (H) 65 - 99 mg/dL   BUN 7 (L) 8 - 27 mg/dL   Creatinine, Ser 0.64 0.57 - 1.00 mg/dL   GFR calc non Af Amer 94 >59 mL/min/1.73   GFR calc Af Amer 108 >59 mL/min/1.73   BUN/Creatinine Ratio 11 11 - 26   Sodium 127 (L) 134 - 144 mmol/L   Potassium 4.7 3.5 - 5.2 mmol/L   Chloride 81 (L) 96 - 106 mmol/L   CO2 26 18 - 29 mmol/L   Calcium 9.8 8.7 - 10.3 mg/dL   Total Protein 7.3 6.0 - 8.5 g/dL   Albumin 4.6 3.6 - 4.8 g/dL   Globulin, Total 2.7 1.5 - 4.5 g/dL    Albumin/Globulin Ratio 1.7 1.2 - 2.2   Bilirubin Total 0.6 0.0 - 1.2 mg/dL   Alkaline Phosphatase 97 39 - 117 IU/L   AST 22 0 - 40 IU/L   ALT 24 0 - 32 IU/L  Microalbumin, Urine Waived  Result Value Ref Range   Microalb, Ur Waived 10 0 - 19 mg/L   Creatinine, Urine Waived 10 10 - 300 mg/dL   Microalb/Creat  Ratio <30 <30 mg/g  Uric acid  Result Value Ref Range   Uric Acid 4.2 2.5 - 7.1 mg/dL  Lipid Panel w/o Chol/HDL Ratio  Result Value Ref Range   Cholesterol, Total 137 100 - 199 mg/dL   Triglycerides 43 0 - 149 mg/dL   HDL 105 >39 mg/dL   VLDL Cholesterol Cal 9 5 - 40 mg/dL   LDL Calculated 23 0 - 99 mg/dL  CBC with Differential/Platelet  Result Value Ref Range   WBC 8.1 3.4 - 10.8 x10E3/uL   RBC 4.74 3.77 - 5.28 x10E6/uL   Hemoglobin 15.8 11.1 - 15.9 g/dL   Hematocrit 45.4 34.0 - 46.6 %   MCV 96 79 - 97 fL   MCH 33.3 (H) 26.6 - 33.0 pg   MCHC 34.8 31.5 - 35.7 g/dL   RDW 14.5 12.3 - 15.4 %   Platelets 416 (H) 150 - 379 x10E3/uL   Neutrophils 72 %   Lymphs 19 %   Monocytes 8 %   Eos 1 %   Basos 0 %   Neutrophils Absolute 5.8 1.4 - 7.0 x10E3/uL   Lymphocytes Absolute 1.5 0.7 - 3.1 x10E3/uL   Monocytes Absolute 0.7 0.1 - 0.9 x10E3/uL   EOS (ABSOLUTE) 0.0 0.0 - 0.4 x10E3/uL   Basophils Absolute 0.0 0.0 - 0.2 x10E3/uL   Immature Granulocytes 0 %   Immature Grans (Abs) 0.0 0.0 - 0.1 x10E3/uL  TSH  Result Value Ref Range   TSH 1.510 0.450 - 4.500 uIU/mL  HIV antibody  Result Value Ref Range   HIV Screen 4th Generation wRfx Non Reactive Non Reactive      Assessment & Plan:   Problem List Items Addressed This Visit      Unprioritized   Abnormal weight loss - Primary    This is significant and pt was lost to f/u.  Close to a 50 pound weight loss in the last year.  Will order chest x-ray again.  Will follow with chest CT once I see results.  Put in GI referral again.  Check labs, particularly CBC, TSH, and sodium.  Discussed pt with Dr. Wynetta Emery.  Consider whole body  CT      Relevant Orders   UA/M w/rflx Culture, Routine   CBC with Differential/Platelet   Comprehensive metabolic panel   TSH   DG Chest 2 View   Advanced care planning/counseling discussion    A voluntary discussion about advance care planning including the explanation and discussion of advance directives was extensively discussed  with the patient.  Explanation about the health care proxy and Living will was reviewed and packet with forms with explanation of how to fill them out was given.  During this discussion, the patient was able to identify a health care proxy as her boyfriend and plans to fill out the paperwork required.  Patient was offered a separate [place] visit for further assistance with forms.         COPD, severe ([place])    Stable, continue present medications.        Relevant Medications   Fluticasone-Salmeterol (ADVAIR DISKUS) 250-50 MCG/DOSE AEPB   albuterol (PROVENTIL HFA;VENTOLIN HFA) 108 (90 Base) MCG/ACT inhaler   Other Relevant Orders   DG Chest 2 View   Heart murmur    This is not new      Hypertension    Very high today and poorly controlled.  Discussed with Dr. Wynetta Emery.  Start Amlodipine and increase Lisinopril.  Recheck CMP  Relevant Medications   lisinopril (PRINIVIL,ZESTRIL) 40 MG tablet   amLODipine (NORVASC) 10 MG tablet   verapamil (CALAN-SR) 240 MG CR tablet   Other Relevant Orders   Lipid Panel w/o Chol/HDL Ratio   Hyponatremia    With weight loss.  Very concerning for malignancy.  Will check today      Relevant Orders   Comprehensive metabolic panel    Other Visit Diagnoses    Need for pneumococcal vaccination       Relevant Orders   Pneumococcal polysaccharide vaccine 23-valent greater than or equal to 2yo subcutaneous/IM (Completed)   Annual physical exam       Pt is here for annual physcial.  Wellness requrements done but due to severe hypertension and significant weight loss. Will hold health maintenance items        Follow up plan: Return in about 2 days (around 09/26/2016).

## 2016-09-24 NOTE — Assessment & Plan Note (Addendum)
This is significant and pt was lost to f/u.  Close to a 50 pound weight loss in the last year.  Will order chest x-ray again.  Will follow with chest CT once I see results.  Put in GI referral again.  Check labs, particularly CBC, TSH, and sodium.  Discussed pt with Dr. Wynetta Emery.  Consider whole body CT

## 2016-09-24 NOTE — Assessment & Plan Note (Signed)
This is not new

## 2016-09-24 NOTE — Assessment & Plan Note (Addendum)
Very high today and poorly controlled.  Discussed with Dr. Wynetta Emery.  Start Amlodipine and increase Lisinopril.  Recheck CMP

## 2016-09-25 LAB — CBC WITH DIFFERENTIAL/PLATELET
BASOS: 1 %
Basophils Absolute: 0.1 10*3/uL (ref 0.0–0.2)
EOS (ABSOLUTE): 0 10*3/uL (ref 0.0–0.4)
EOS: 0 %
Hematocrit: 45 % (ref 34.0–46.6)
Hemoglobin: 16.2 g/dL — ABNORMAL HIGH (ref 11.1–15.9)
IMMATURE GRANS (ABS): 0 10*3/uL (ref 0.0–0.1)
IMMATURE GRANULOCYTES: 0 %
LYMPHS: 16 %
Lymphocytes Absolute: 1.2 10*3/uL (ref 0.7–3.1)
MCH: 36.7 pg — ABNORMAL HIGH (ref 26.6–33.0)
MCHC: 36 g/dL — ABNORMAL HIGH (ref 31.5–35.7)
MCV: 102 fL — AB (ref 79–97)
Monocytes Absolute: 0.8 10*3/uL (ref 0.1–0.9)
Monocytes: 10 %
NEUTROS PCT: 73 %
Neutrophils Absolute: 5.7 10*3/uL (ref 1.4–7.0)
PLATELETS: 269 10*3/uL (ref 150–379)
RBC: 4.42 x10E6/uL (ref 3.77–5.28)
RDW: 14.2 % (ref 12.3–15.4)
WBC: 7.8 10*3/uL (ref 3.4–10.8)

## 2016-09-25 LAB — COMPREHENSIVE METABOLIC PANEL
A/G RATIO: 2 (ref 1.2–2.2)
ALT: 21 IU/L (ref 0–32)
AST: 29 IU/L (ref 0–40)
Albumin: 4.9 g/dL — ABNORMAL HIGH (ref 3.6–4.8)
Alkaline Phosphatase: 86 IU/L (ref 39–117)
BUN/Creatinine Ratio: 13 (ref 12–28)
BUN: 6 mg/dL — AB (ref 8–27)
Bilirubin Total: 0.7 mg/dL (ref 0.0–1.2)
CALCIUM: 9.9 mg/dL (ref 8.7–10.3)
CO2: 22 mmol/L (ref 20–29)
CREATININE: 0.48 mg/dL — AB (ref 0.57–1.00)
Chloride: 86 mmol/L — ABNORMAL LOW (ref 96–106)
GFR, EST AFRICAN AMERICAN: 117 mL/min/{1.73_m2} (ref >59)
GFR, EST NON AFRICAN AMERICAN: 102 mL/min/{1.73_m2} (ref >59)
Globulin, Total: 2.4 g/dL (ref 1.5–4.5)
Glucose: 97 mg/dL (ref 65–99)
POTASSIUM: 4.2 mmol/L (ref 3.5–5.2)
Sodium: 131 mmol/L — ABNORMAL LOW (ref 134–144)
TOTAL PROTEIN: 7.3 g/dL (ref 6.0–8.5)

## 2016-09-25 LAB — LIPID PANEL W/O CHOL/HDL RATIO
Cholesterol, Total: 183 mg/dL (ref 100–199)
HDL: 139 mg/dL (ref >39)
LDL CALC: 36 mg/dL (ref 0–99)
Triglycerides: 38 mg/dL (ref 0–149)
VLDL CHOLESTEROL CAL: 8 mg/dL (ref 5–40)

## 2016-09-25 LAB — TSH: TSH: 1.64 u[IU]/mL (ref 0.450–4.500)

## 2016-09-26 ENCOUNTER — Ambulatory Visit
Admission: RE | Admit: 2016-09-26 | Discharge: 2016-09-26 | Disposition: A | Discharge status: Home / Self Care | Payer: Medicare Other | Source: Ambulatory Visit | Attending: Unknown Physician Specialty | Admitting: Unknown Physician Specialty

## 2016-09-26 DIAGNOSIS — Z681 Body mass index (BMI) 19 or less, adult: Secondary | ICD-10-CM | POA: Diagnosis not present

## 2016-09-26 DIAGNOSIS — J449 Chronic obstructive pulmonary disease, unspecified: Secondary | ICD-10-CM

## 2016-09-26 DIAGNOSIS — R634 Abnormal weight loss: Secondary | ICD-10-CM | POA: Diagnosis not present

## 2016-09-29 ENCOUNTER — Encounter: Payer: Self-pay | Admitting: Unknown Physician Specialty

## 2016-09-29 ENCOUNTER — Ambulatory Visit (INDEPENDENT_AMBULATORY_CARE_PROVIDER_SITE_OTHER): Payer: Medicare Other | Admitting: Unknown Physician Specialty

## 2016-09-29 VITALS — BP 163/72 | HR 94 | Temp 97.6°F | Wt 106.0 lb

## 2016-09-29 DIAGNOSIS — I10 Essential (primary) hypertension: Secondary | ICD-10-CM

## 2016-09-29 DIAGNOSIS — R634 Abnormal weight loss: Secondary | ICD-10-CM

## 2016-09-29 DIAGNOSIS — F17200 Nicotine dependence, unspecified, uncomplicated: Secondary | ICD-10-CM | POA: Diagnosis not present

## 2016-09-29 DIAGNOSIS — D7589 Other specified diseases of blood and blood-forming organs: Secondary | ICD-10-CM | POA: Insufficient documentation

## 2016-09-29 DIAGNOSIS — R0602 Shortness of breath: Secondary | ICD-10-CM

## 2016-09-29 NOTE — Progress Notes (Signed)
BP (!) 163/72   Pulse 94   Temp 97.6 F (36.4 C)   Wt 106 lb (48.1 kg)   LMP  (LMP Unknown)   SpO2 94%   BMI 18.42 kg/m    Subjective:    Patient ID: Veronica Hernandez, female    DOB: May 24, 1950, 66 y.o.   MRN: [id number]  HPI: Veronica Hernandez is a 66 y.o. female  Chief Complaint  Patient presents with  . Follow-up    pt states that she could not take the amlodipine, states it made her dizzy    Hypertension Pt states she could not take Amlodipine due to dizzyness. SOB but typical for her.  No swelling.    Abnormal weight loss Pt feels this is due to stress due to brother with lung cancer.  She does have ketones in urine pointing to malnutrition.   Labs were not illuminating other than macrocytic.  Chest x-ray is norma besides lung disease.  She mild abdominal cramping that comes and goes.  No new respiratory symptoms but does have baseline SOB possible related to COPD  Relevant past medical, surgical, family and social history reviewed and updated as indicated. Interim medical history since our last visit reviewed. Allergies and medications reviewed and updated.  Review of Systems  Per HPI unless specifically indicated above     Objective:    BP (!) 163/72   Pulse 94   Temp 97.6 F (36.4 C)   Wt 106 lb (48.1 kg)   LMP  (LMP Unknown)   SpO2 94%   BMI 18.42 kg/m   Wt Readings from Last 3 Encounters:  09/29/16 106 lb (48.1 kg)  09/24/16 106 lb 1.6 oz (48.1 kg)  05/01/15 151 lb 3.2 oz (68.6 kg)    Physical Exam  Constitutional: She is oriented to person, place, and time. She appears cachectic. No distress.  HENT:  Head: Normocephalic and atraumatic.  Eyes: Conjunctivae and lids are normal. Right eye exhibits no discharge. Left eye exhibits no discharge. No scleral icterus.  Cardiovascular: Normal rate.   Pulmonary/Chest: Effort normal.  Abdominal: Normal appearance. There is no splenomegaly or hepatomegaly.  Musculoskeletal: Normal range of motion.  Neurological: She  is oriented to person, place, and time.  Skin: Skin is intact. No rash noted. No pallor.  Psychiatric: She has a normal mood and affect. Her behavior is normal. Judgment and thought content normal.    Results for orders placed or performed in visit on 09/24/16  Microscopic Examination  Result Value Ref Range   WBC, UA 0-5 0 - 5 /hpf   RBC, UA 0-2 0 - 2 /hpf   Epithelial Cells (non renal) 0-10 0 - 10 /hpf   Bacteria, UA None seen None seen/Few  UA/M w/rflx Culture, Routine  Result Value Ref Range   Specific Gravity, UA 1.015 1.005 - 1.030   pH, UA 7.5 5.0 - 7.5   Color, UA Yellow Yellow   Appearance Ur Clear Clear   Leukocytes, UA Negative Negative   Protein, UA 1+ (A) Negative/Trace   Glucose, UA Negative Negative   Ketones, UA 2+ (A) Negative   RBC, UA Negative Negative   Bilirubin, UA Negative Negative   Urobilinogen, Ur 0.2 0.2 - 1.0 mg/dL   Nitrite, UA Negative Negative   Microscopic Examination See below:   CBC with Differential/Platelet  Result Value Ref Range   WBC 7.8 3.4 - 10.8 x10E3/uL   RBC 4.42 3.77 - 5.28 x10E6/uL   Hemoglobin 16.2 (H) 11.1 -  15.9 g/dL   Hematocrit 45.0 34.0 - 46.6 %   MCV 102 (H) 79 - 97 fL   MCH 36.7 (H) 26.6 - 33.0 pg   MCHC 36.0 (H) 31.5 - 35.7 g/dL   RDW 14.2 12.3 - 15.4 %   Platelets 269 150 - 379 x10E3/uL   Neutrophils 73 Not Estab. %   Lymphs 16 Not Estab. %   Monocytes 10 Not Estab. %   Eos 0 Not Estab. %   Basos 1 Not Estab. %   Neutrophils Absolute 5.7 1.4 - 7.0 x10E3/uL   Lymphocytes Absolute 1.2 0.7 - 3.1 x10E3/uL   Monocytes Absolute 0.8 0.1 - 0.9 x10E3/uL   EOS (ABSOLUTE) 0.0 0.0 - 0.4 x10E3/uL   Basophils Absolute 0.1 0.0 - 0.2 x10E3/uL   Immature Granulocytes 0 Not Estab. %   Immature Grans (Abs) 0.0 0.0 - 0.1 x10E3/uL  Comprehensive metabolic panel  Result Value Ref Range   Glucose 97 65 - 99 mg/dL   BUN 6 (L) 8 - 27 mg/dL   Creatinine, Ser 0.48 (L) 0.57 - 1.00 mg/dL   GFR calc non Af Amer 102 >59 mL/min/1.73    GFR calc Af Amer 117 >59 mL/min/1.73   BUN/Creatinine Ratio 13 12 - 28   Sodium 131 (L) 134 - 144 mmol/L   Potassium 4.2 3.5 - 5.2 mmol/L   Chloride 86 (L) 96 - 106 mmol/L   CO2 22 20 - 29 mmol/L   Calcium 9.9 8.7 - 10.3 mg/dL   Total Protein 7.3 6.0 - 8.5 g/dL   Albumin 4.9 (H) 3.6 - 4.8 g/dL   Globulin, Total 2.4 1.5 - 4.5 g/dL   Albumin/Globulin Ratio 2.0 1.2 - 2.2   Bilirubin Total 0.7 0.0 - 1.2 mg/dL   Alkaline Phosphatase 86 39 - 117 IU/L   AST 29 0 - 40 IU/L   ALT 21 0 - 32 IU/L  Lipid Panel w/o Chol/HDL Ratio  Result Value Ref Range   Cholesterol, Total 183 100 - 199 mg/dL   Triglycerides 38 0 - 149 mg/dL   HDL 139 >39 mg/dL   VLDL Cholesterol Cal 8 5 - 40 mg/dL   LDL Calculated 36 0 - 99 mg/dL  TSH  Result Value Ref Range   TSH 1.640 0.450 - 4.500 uIU/mL      Assessment & Plan:   Problem List Items Addressed This Visit      Unprioritized   Abnormal weight loss    Labs and chest x-ray not remarkable.  Discussed with Dr. Wynetta Emery.  Will get chest and abdominal CT.        Relevant Orders   CT Abdomen Pelvis W Contrast   CT Chest W Contrast   Hypertension    Some improvement.  Change Amlodipine to 1/2/day for 5 mg /day      Macrocytosis    Unable to order B12 and folate at this time due to Epic/Medicare issues.  Await CT scans      Tobacco dependence - Primary   Relevant Orders   CT Chest W Contrast    Other Visit Diagnoses    Shortness of breath       Relevant Orders   CT Chest W Contrast       Follow up plan: Return in about 4 weeks (around 10/27/2016).

## 2016-09-29 NOTE — Assessment & Plan Note (Signed)
Unable to order B12 and folate at this time due to Epic/Medicare issues.  Await CT scans

## 2016-09-29 NOTE — Assessment & Plan Note (Signed)
Labs and chest x-ray not remarkable.  Discussed with Dr. Wynetta Emery.  Will get chest and abdominal CT.

## 2016-09-29 NOTE — Assessment & Plan Note (Signed)
Some improvement.  Change Amlodipine to 1/2/day for 5 mg /day

## 2016-10-01 ENCOUNTER — Telehealth: Payer: Self-pay | Admitting: Unknown Physician Specialty

## 2016-10-01 NOTE — Telephone Encounter (Signed)
Patient would like to know if she is scheduled for a CT scan. Patient was unsure due to talking with provider about a CT scan during last appointment.  Please Advise.  Thank you

## 2016-10-01 NOTE — Telephone Encounter (Signed)
Patient returned my call. She states that she is aware of her CT scan appointment but states she will be on a cruise then. Patient wanted the number to the imaging center to reschedule her appointment so I provided that to her. Patient stated that she would have the scans done once she returned.

## 2016-10-01 NOTE — Telephone Encounter (Signed)
Called and left patient a VM asking for her to please return my call. According to chart, patient has CT scan scheduled for Friday 10/03/16 at 11 AM at [facility] [address].

## 2016-10-03 ENCOUNTER — Ambulatory Visit: Payer: Medicare Other

## 2016-10-29 ENCOUNTER — Ambulatory Visit: Payer: Medicare Other | Admitting: Unknown Physician Specialty

## 2016-11-30 DIAGNOSIS — Z23 Encounter for immunization: Secondary | ICD-10-CM | POA: Diagnosis not present

## 2017-02-23 ENCOUNTER — Telehealth: Payer: Self-pay | Admitting: Family Medicine

## 2017-02-23 NOTE — Telephone Encounter (Signed)
Copied from [place] [id number]. Topic: General - Deceased Patient >> 2017/02/22  9:40 AM Bea Graff, NT wrote: Reason for CRM: Pt passed away on 02/20/2017 at 10:17am. [facility] called on 2017/02/22. They will be bringing death certificate for Dr. Julian Hy to sign.  Route to department's PEC Pool.  >> 2017/02/22 10:01 AM Georgina Peer, CMA wrote: Routing to [place], [place].

## 2017-03-06 DIAGNOSIS — 419620001 Death: Secondary | SNOMED CT | POA: Diagnosis not present

## 2017-03-06 DEATH — deceased

## 2018-01-13 IMAGING — CR DG CHEST 2V
1 series · 2 of 2 positions shown · non-contrast
Comparison: 05/01/2015

CLINICAL DATA: Abnormal weight loss

EXAM:
CHEST  2 VIEW

[Series 1: dg chest 2 view · 0.14mm/px · 2 of 2 slices shown]
[im 1/2]
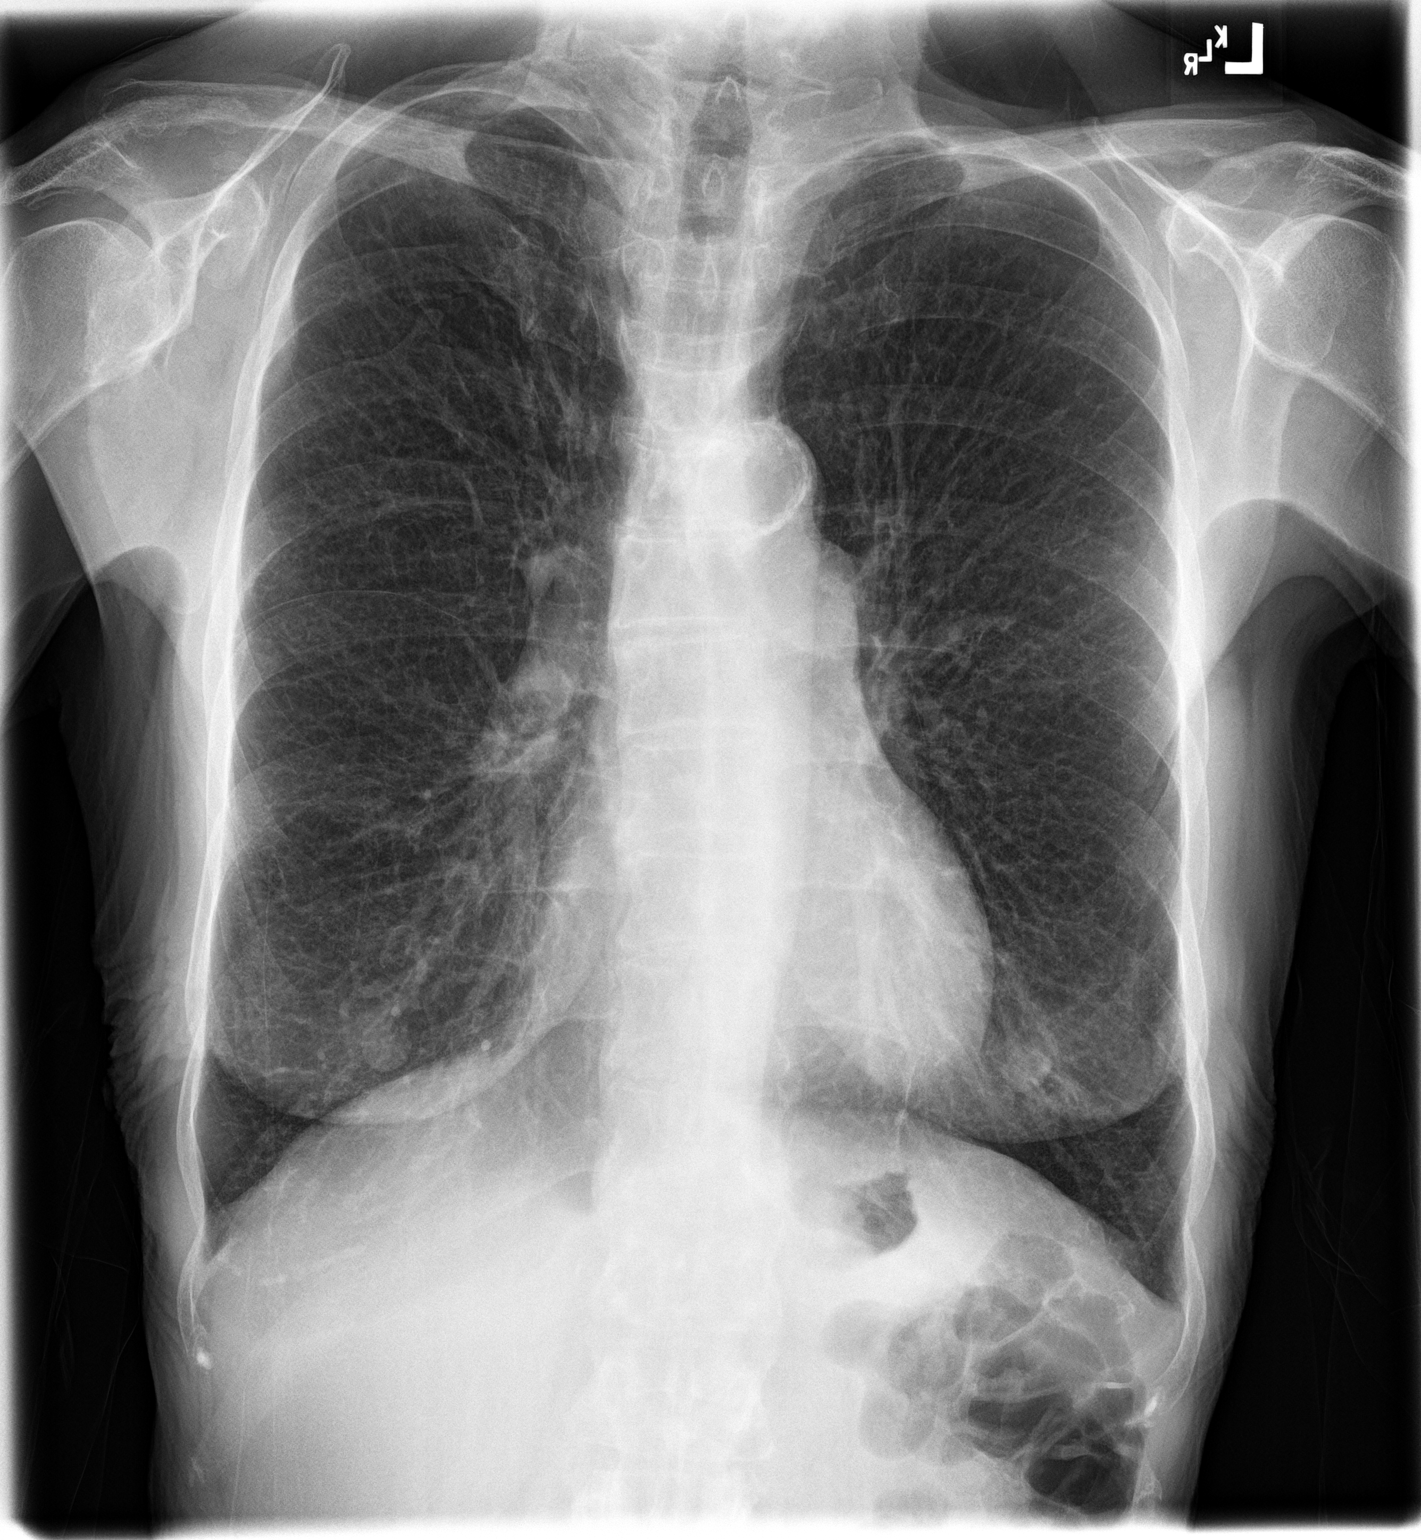
[im 2/2]
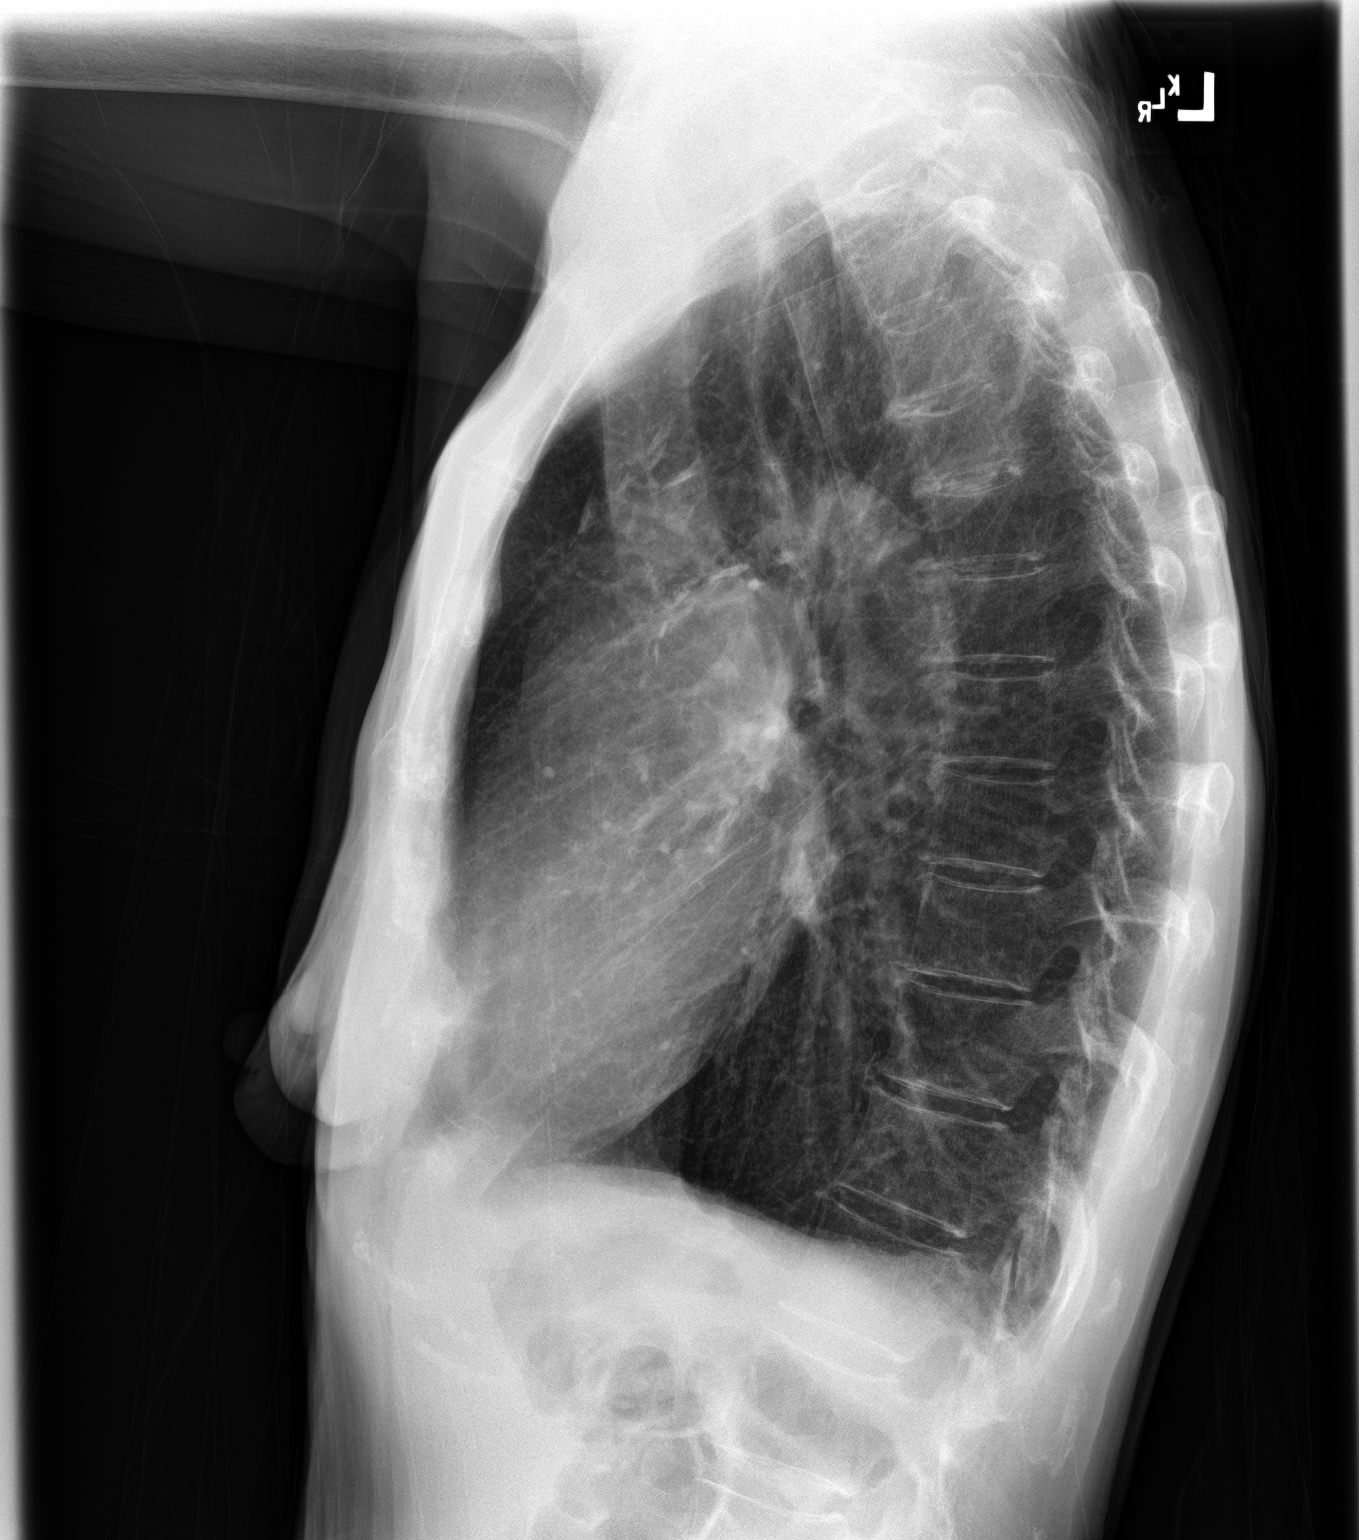

[2 of 2 positions shown; findings below may reference images not displayed]

FINDINGS: There is hyperinflation of the lungs compatible with COPD. Heart and
mediastinal contours are within normal limits. No focal opacities or
effusions. No acute bony abnormality. Calcifications in the aorta.
IMPRESSION: COPD.  No active disease.
